# Patient Record
Sex: Female | Born: 1990 | Race: Black or African American | Hispanic: No | Marital: Single | Smoking: Never smoker
Health system: Southern US, Community
[De-identification: ages and names within clinical notes are randomized; demographics above are authoritative.]

## PROBLEM LIST (undated history)

## (undated) HISTORY — PX: TONSILLECTOMY: SUR1361

---

## 2015-11-30 ENCOUNTER — Emergency Department (HOSPITAL_COMMUNITY)
Admission: EM | Admit: 2015-11-30 | Discharge: 2015-11-30 | Disposition: A | Discharge status: Home / Self Care | Payer: Self-pay | Attending: Emergency Medicine | Admitting: Emergency Medicine

## 2015-11-30 ENCOUNTER — Encounter (HOSPITAL_COMMUNITY): Payer: Self-pay | Admitting: Emergency Medicine

## 2015-11-30 DIAGNOSIS — J069 Acute upper respiratory infection, unspecified: Secondary | ICD-10-CM | POA: Insufficient documentation

## 2015-11-30 LAB — RAPID STREP SCREEN (MED CTR MEBANE ONLY): Streptococcus, Group A Screen (Direct): NEGATIVE

## 2015-11-30 NOTE — ED Provider Notes (Signed)
[facility]-EMERGENCY DEPT Provider Note   CSN: [id number] Arrival date & time: 11/30/15  1155  By signing my name below, I, [name], attest that this documentation has been prepared under the direction and in the presence of non-physician practitioner, Teressa Lower, NP. Electronically Signed: [name], Scribe. 11/30/2015. 12:28 PM.    History   Chief Complaint Chief Complaint  Patient presents with  . Sore Throat  . URI  . Headache     The history is provided by the patient. No language interpreter was used.     HPI Comments:  Connie Watson is a 25 y.o. female who presents to the Emergency Department complaining of a sore throat and cough which began yesterday. She reports associated congestion, generalized body aches, and HA . Pt rates her pain a 5/10. She denies fever. No alleviating factors noted; no treatments tried PTA.    History reviewed. No pertinent past medical history.  There are no active problems to display for this patient.   Past Surgical History:  Procedure Laterality Date  . TONSILLECTOMY      OB History    No data available       Home Medications    Prior to Admission medications   Not on File    Family History No family history on file.  Social History Social History  Substance Use Topics  . Smoking status: Not on file  . Smokeless tobacco: Not on file  . Alcohol use Not on file     Allergies   Review of patient's allergies indicates no known allergies.   Review of Systems Review of Systems  Constitutional: Negative for fever.  HENT: Positive for congestion and sore throat.   Respiratory: Positive for cough.   Musculoskeletal: Positive for myalgias.  Neurological: Positive for headaches.     Physical Exam Updated Vital Signs BP 126/66 (BP Location: Right Arm)   Pulse 63   Temp 98.4 F (36.9 C) (Oral)   Resp 18   LMP 11/23/2015   SpO2 100%   Physical Exam  Constitutional: She is oriented to person,  place, and time. She appears well-developed and well-nourished. No distress.  HENT:  Head: Normocephalic and atraumatic.  Right Ear: External ear normal.  Left Ear: External ear normal.  Mouth/Throat: No oropharyngeal exudate.  Eyes: Conjunctivae are normal.  Cardiovascular: Normal rate.   Pulmonary/Chest: Effort normal.  Abdominal: She exhibits no distension.  Neurological: She is alert and oriented to person, place, and time.  Skin: Skin is warm and dry.  Psychiatric: She has a normal mood and affect.  Nursing note and vitals reviewed.    ED Treatments / Results  DIAGNOSTIC STUDIES:  Oxygen Saturation is 100% on RA, normal by my interpretation.    COORDINATION OF CARE:  12:26 PM Discussed treatment plan with pt at bedside and pt agreed to plan.  Labs (all labs ordered are listed, but only abnormal results are displayed) Labs Reviewed  RAPID STREP SCREEN (NOT AT [facility])    EKG  EKG Interpretation None       Radiology No results found.  Procedures Procedures (including critical care time)  Medications Ordered in ED Medications - No data to display   Initial Impression / Assessment and Plan / ED Course  I have reviewed the triage vital signs and the nursing notes.  Pertinent labs & imaging results that were available during my care of the patient were reviewed by me and considered in my medical decision making (see chart for details).  Clinical Course   Likely viral don't think any antibiotics are needed at this time  Pt with negative strep. No abx indicated at this time. Discussed that results of strep culture are pending and patient will be informed if positive result and abx will be called in at that time. Discharge with symptomatic tx. No evidence of dehydration. Pt is tolerating secretions. Presentation not concerning for peritonsillar abscess or spread of infection to deep spaces of the throat; patent airway. Specific return precautions discussed.  Recommended PCP follow up. Pt appears safe for discharge.  Final Clinical Impressions(s) / ED Diagnoses   Final diagnoses:  None    New Prescriptions New Prescriptions   No medications on file   I personally performed the services described in this documentation, which was scribed in my presence. The recorded information has been reviewed and is accurate.    Teressa LowerVrinda Alistar Mcenery, NP 11/30/15 1248    Teressa LowerVrinda Symon Norwood, NP 11/30/15 1249    Shaune Pollackameron Isaacs, MD 11/30/15 1752

## 2015-12-02 LAB — CULTURE, GROUP A STREP (THRC)

## 2015-12-05 ENCOUNTER — Encounter (HOSPITAL_COMMUNITY): Payer: Self-pay | Admitting: Emergency Medicine

## 2015-12-05 ENCOUNTER — Emergency Department (HOSPITAL_COMMUNITY): Payer: Self-pay

## 2015-12-05 ENCOUNTER — Emergency Department (HOSPITAL_COMMUNITY)
Admission: EM | Admit: 2015-12-05 | Discharge: 2015-12-05 | Disposition: A | Discharge status: Home / Self Care | Payer: Self-pay | Attending: Emergency Medicine | Admitting: Emergency Medicine

## 2015-12-05 DIAGNOSIS — R51 Headache: Secondary | ICD-10-CM | POA: Insufficient documentation

## 2015-12-05 DIAGNOSIS — H9203 Otalgia, bilateral: Secondary | ICD-10-CM | POA: Insufficient documentation

## 2015-12-05 DIAGNOSIS — R519 Headache, unspecified: Secondary | ICD-10-CM

## 2015-12-05 LAB — CBC WITH DIFFERENTIAL/PLATELET
BASOS ABS: 0 10*3/uL (ref 0.0–0.1)
BASOS PCT: 0 %
EOS PCT: 1 %
Eosinophils Absolute: 0.1 10*3/uL (ref 0.0–0.7)
HCT: 41.2 % (ref 36.0–46.0)
Hemoglobin: 13.7 g/dL (ref 12.0–15.0)
Lymphocytes Relative: 30 %
Lymphs Abs: 3.7 10*3/uL (ref 0.7–4.0)
MCH: 30.6 pg (ref 26.0–34.0)
MCHC: 33.3 g/dL (ref 30.0–36.0)
MCV: 92 fL (ref 78.0–100.0)
MONO ABS: 0.9 10*3/uL (ref 0.1–1.0)
MONOS PCT: 8 %
Neutro Abs: 7.5 10*3/uL (ref 1.7–7.7)
Neutrophils Relative %: 61 %
PLATELETS: 229 10*3/uL (ref 150–400)
RBC: 4.48 MIL/uL (ref 3.87–5.11)
RDW: 12.2 % (ref 11.5–15.5)
WBC: 12.2 10*3/uL — ABNORMAL HIGH (ref 4.0–10.5)

## 2015-12-05 LAB — BASIC METABOLIC PANEL
Anion gap: 4 — ABNORMAL LOW (ref 5–15)
BUN: 10 mg/dL (ref 6–20)
CALCIUM: 9.4 mg/dL (ref 8.9–10.3)
CO2: 25 mmol/L (ref 22–32)
CREATININE: 0.79 mg/dL (ref 0.44–1.00)
Chloride: 107 mmol/L (ref 101–111)
GFR calc Af Amer: >60 mL/min (ref >60)
GLUCOSE: 78 mg/dL (ref 65–99)
Potassium: 4.1 mmol/L (ref 3.5–5.1)
Sodium: 136 mmol/L (ref 135–145)

## 2015-12-05 LAB — I-STAT BETA HCG BLOOD, ED (MC, WL, AP ONLY): I-stat hCG, quantitative: <5 m[IU]/mL (ref <5)

## 2015-12-05 MED ORDER — HYDROCODONE-ACETAMINOPHEN 5-325 MG PO TABS: 1.0000 | ORAL_TABLET | Freq: Once | ORAL | Status: DC

## 2015-12-05 MED ORDER — DIPHENHYDRAMINE HCL 25 MG PO CAPS
25.0000 mg | ORAL_CAPSULE | Freq: Once | ORAL | Status: AC
  Administered 2015-12-05: 25 mg via ORAL
  Filled 2015-12-05: qty 1

## 2015-12-05 MED ORDER — FLUTICASONE PROPIONATE 50 MCG/ACT NA SUSP: 2.0000 | Freq: Every day | NASAL | 0 refills | Status: AC

## 2015-12-05 MED ORDER — ONDANSETRON HCL 4 MG/2ML IJ SOLN: 4.0000 mg | Freq: Once | INTRAMUSCULAR | Status: DC

## 2015-12-05 MED ORDER — CARBAMIDE PEROXIDE 6.5 % OT SOLN
5.0000 [drp] | Freq: Once | OTIC | Status: AC
  Administered 2015-12-05: 5 [drp] via OTIC
  Filled 2015-12-05: qty 15

## 2015-12-05 MED ORDER — PSEUDOEPHEDRINE HCL ER 120 MG PO TB12: 120.0000 mg | ORAL_TABLET | Freq: Two times a day (BID) | ORAL | 0 refills | Status: AC

## 2015-12-05 MED ORDER — HYDROCODONE-ACETAMINOPHEN 5-325 MG PO TABS
1.0000 | ORAL_TABLET | Freq: Once | ORAL | Status: AC
  Administered 2015-12-05: 1 via ORAL
  Filled 2015-12-05: qty 1

## 2015-12-05 MED ORDER — DEXAMETHASONE SODIUM PHOSPHATE 10 MG/ML IJ SOLN
10.0000 mg | Freq: Once | INTRAMUSCULAR | Status: AC
  Administered 2015-12-05: 10 mg via INTRAMUSCULAR
  Filled 2015-12-05: qty 1

## 2015-12-05 MED ORDER — DOXYCYCLINE HYCLATE 100 MG PO CAPS: 100.0000 mg | ORAL_CAPSULE | Freq: Two times a day (BID) | ORAL | 0 refills | Status: AC

## 2015-12-05 MED ORDER — KETOROLAC TROMETHAMINE 60 MG/2ML IM SOLN
60.0000 mg | Freq: Once | INTRAMUSCULAR | Status: AC
  Administered 2015-12-05: 60 mg via INTRAMUSCULAR
  Filled 2015-12-05: qty 2

## 2015-12-05 MED ORDER — ONDANSETRON 4 MG PO TBDP
8.0000 mg | ORAL_TABLET | Freq: Once | ORAL | Status: AC
  Administered 2015-12-05: 8 mg via ORAL
  Filled 2015-12-05: qty 2

## 2015-12-05 MED ORDER — DOXYCYCLINE HYCLATE 100 MG PO TABS
100.0000 mg | ORAL_TABLET | Freq: Once | ORAL | Status: AC
  Administered 2015-12-05: 100 mg via ORAL
  Filled 2015-12-05: qty 1

## 2015-12-05 NOTE — ED Triage Notes (Signed)
PT reported To PA  That she passed out this morning.

## 2015-12-05 NOTE — Discharge Instructions (Signed)
Read the information below.  Your head CT did not show any intracranial abnormalities. You did show some thickening of your sinuses, which may be contributing to your headache and ear pain.  We were able to get wax out of your right ear. Your left ear still has wax. You declined manual removal. Please instill 5 drops twice daily in your left ear for the next four days to help relieve the ear wax.  I have prescribed flonase for nasal congestion, sudafed to help dry the sinuses, and doxycyline to treat possible sinus infection.  You can take tylenol 650mg  every 6hrs or motrin 400mg  every 6hrs for pain relief.  I encourage you to follow up with both ENT and [facility] for further management of your ear pain and headaches.  You may return to the Emergency Department at any time for worsening condition or any new symptoms that concern you. Return to ED if you develop fever, neck pain, trouble swallowing, trouble breathing, neurologic symptoms, or any new/concerning symptoms.

## 2015-12-05 NOTE — ED Provider Notes (Signed)
[facility] Provider Note   CSN: [id number] Arrival date & time: 12/05/15  1225   By signing my name below, I, Clovis Pu, attest that this documentation has been prepared under the direction and in the presence of  Arvilla Meres, PA-C. Electronically Signed: Clovis Pu, ED Scribe. 12/05/15. 4:45 PM.   History   Chief Complaint Chief Complaint  Patient presents with  . Headache  . Otalgia    The history is provided by the patient. No language interpreter was used.   HPI Comments:  Connie Watson is a 25 y.o. female who presents to the Emergency Department complaining of "9/10" throbbing diffuse headaches and b/l ear pain x several years. Pt notes associated light-headedness and intermittent tinnitus. She states she is here today because she felt light-headed and was about to pass out. Pt also notes she has a headaches every day which begin when she wakes up. Denies any allergies. No alleviating factors noted. She has been given ear drops in the past for her ear pain with minimal relief. Pt denies head trauma, syncope, nausea, vomiting, numbness to her BUE and BLE, visual disturbances,neck pain, and trouble swallowing, sinus pressure, nasal congestion, rhinorrhea, cough, chest pain, SOB, rash, fevers, hematuria, dysuria, facial droop and slurred speech. She also denies a hx of cancer, recent travels, hormone therapy, recent illnesses. Pt does not have a PCP and has never seen a specialist for her headaches.   History reviewed. No pertinent past medical history.  There are no active problems to display for this patient.   Past Surgical History:  Procedure Laterality Date  . TONSILLECTOMY      OB History    No data available       Home Medications    Prior to Admission medications   Medication Sig Start Date End Date Taking? Authorizing Provider  acetaminophen (TYLENOL) 325 MG tablet Take 650 mg by mouth every 6 (six) hours as needed for mild pain.   Yes Historical  Provider, MD  ibuprofen (ADVIL,MOTRIN) 200 MG tablet Take 200 mg by mouth every 6 (six) hours as needed for moderate pain.   Yes Historical Provider, MD  doxycycline (VIBRAMYCIN) 100 MG capsule Take 1 capsule (100 mg total) by mouth 2 (two) times daily. 12/05/15 12/10/15  Lona Kettle, PA-C  fluticasone (FLONASE) 50 MCG/ACT nasal spray Place 2 sprays into both nostrils daily. 12/05/15   Lona Kettle, PA-C  pseudoephedrine (SUDAFED 12 HOUR) 120 MG 12 hr tablet Take 1 tablet (120 mg total) by mouth 2 (two) times daily. 12/05/15   Lona Kettle, PA-C    Family History History reviewed. No pertinent family history.  Social History Social History  Substance Use Topics  . Smoking status: Never Smoker  . Smokeless tobacco: Never Used  . Alcohol use Yes     Allergies   Penicillins   Review of Systems Review of Systems  Constitutional: Negative for fever.  HENT: Positive for ear pain and tinnitus ( intermittent ). Negative for congestion, rhinorrhea, sinus pressure and trouble swallowing.   Eyes: Negative for visual disturbance.  Respiratory: Negative for cough and shortness of breath.   Cardiovascular: Negative for chest pain.  Gastrointestinal: Negative for nausea and vomiting.  Genitourinary: Negative for dysuria and hematuria.  Musculoskeletal: Negative for neck pain.  Skin: Negative for rash.  Neurological: Positive for light-headedness and headaches. Negative for syncope, facial asymmetry, speech difficulty, weakness and numbness.     Physical Exam Updated Vital Signs BP 110/56 (BP Location: Left  Arm)   Pulse 61   Temp 98 F (36.7 C) (Oral)   Resp 18   LMP 11/23/2015   SpO2 100%   Physical Exam  Constitutional: She appears well-developed and well-nourished. No distress.  HENT:  Head: Normocephalic and atraumatic.  Mouth/Throat: Uvula is midline, oropharynx is clear and moist and mucous membranes are normal. No trismus in the jaw. No oropharyngeal  exudate. No tonsillar exudate.  Cerumen in bilateral ears. No trismus. Uvula midline.   Eyes: Conjunctivae and EOM are normal. Pupils are equal, round, and reactive to light. Right eye exhibits no discharge. Left eye exhibits no discharge. No scleral icterus.  Neck: Normal range of motion and phonation normal. Neck supple. No neck rigidity. Normal range of motion present.  No nuchal rigidity.   Cardiovascular: Normal rate, regular rhythm, normal heart sounds and intact distal pulses.   No murmur heard. Pulmonary/Chest: Effort normal and breath sounds normal. No stridor. No respiratory distress. She has no wheezes. She has no rales.  Abdominal: Soft. Bowel sounds are normal. She exhibits no distension. There is no tenderness. There is no rigidity, no rebound, no guarding and no CVA tenderness.  Musculoskeletal: Normal range of motion.  Lymphadenopathy:    She has no cervical adenopathy.  Neurological: She is alert. She is not disoriented. She exhibits normal muscle tone. Coordination and gait normal. GCS eye subscore is 4. GCS verbal subscore is 5. GCS motor subscore is 6.  Mental Status:  Alert, thought content appropriate, able to give a coherent history. Speech fluent without evidence of aphasia. Able to follow 2 step commands without difficulty.  Cranial Nerves:  II:  pupils equal, round, reactive to light III,IV, VI: ptosis not present, extra-ocular motions intact bilaterally  V,VII: smile symmetric, facial light touch sensation equal VIII: hearing grossly normal to voice  X: uvula elevates symmetrically  XI: bilateral shoulder shrug symmetric and strong XII: midline tongue extension without fassiculations Motor:  Normal tone. 5/5 in upper and lower extremities bilaterally including strong and equal grip strength and dorsiflexion/plantar flexion Sensory: light touch normal in all extremities. Cerebellar: normal finger-to-nose with bilateral upper extremities Gait: normal gait and  balance CV: distal pulses palpable throughout   Skin: Skin is warm and dry. She is not diaphoretic.  Psychiatric: She has a normal mood and affect. Her behavior is normal.  Nursing note and vitals reviewed.    ED Treatments / Results  DIAGNOSTIC STUDIES:  Oxygen Saturation is 100% on RA, normal by my interpretation.    COORDINATION OF CARE:  4:42 PM Discussed treatment plan with pt at bedside and pt agreed to plan.  Labs (all labs ordered are listed, but only abnormal results are displayed) Labs Reviewed  BASIC METABOLIC PANEL - Abnormal; Notable for the following:       Result Value   Anion gap 4 (*)    All other components within normal limits  CBC WITH DIFFERENTIAL/PLATELET - Abnormal; Notable for the following:    WBC 12.2 (*)    All other components within normal limits  I-STAT BETA HCG BLOOD, ED ([facility], [facility], [facility] ONLY)    EKG  EKG Interpretation None       Radiology Ct Head Wo Contrast  Result Date: 12/05/2015 CLINICAL DATA:  Headache for several weeks.  Bilateral headache. EXAM: CT HEAD WITHOUT CONTRAST TECHNIQUE: Contiguous axial images were obtained from the base of the skull through the vertex without intravenous contrast. COMPARISON:  None. FINDINGS: Brain: No evidence for acute hemorrhage, mass lesion,  midline shift, hydrocephalus or large infarct. Vascular: No hyperdense vessel or unexpected calcification. Skull: Normal. Negative for fracture or focal lesion. Sinuses/Orbits: Mucosal thickening in the visualized ethmoid air cells and sphenoid sinuses. Other: None. IMPRESSION: No acute intracranial abnormality. Paranasal sinus disease. Electronically Signed   By: Richarda Overlie M.D.   On: 12/05/2015 17:33    Procedures Procedures (including critical care time)  Medications Ordered in ED Medications  carbamide peroxide (DEBROX) 6.5 % otic solution 5 drop (5 drops Both Ears Given 12/05/15 1731)  diphenhydrAMINE (BENADRYL) capsule 25 mg (25 mg Oral Given 12/05/15 1748)   ketorolac (TORADOL) injection 60 mg (60 mg Intramuscular Given 12/05/15 1748)  ondansetron (ZOFRAN-ODT) disintegrating tablet 8 mg (8 mg Oral Given 12/05/15 1748)  HYDROcodone-acetaminophen (NORCO/VICODIN) 5-325 MG per tablet 1 tablet (1 tablet Oral Given 12/05/15 1907)  dexamethasone (DECADRON) injection 10 mg (10 mg Intramuscular Given 12/05/15 2017)  doxycycline (VIBRA-TABS) tablet 100 mg (100 mg Oral Given 12/05/15 2018)     Initial Impression / Assessment and Plan / ED Course  I have reviewed the triage vital signs and the nursing notes.  Pertinent labs & imaging results that were available during my care of the patient were reviewed by me and considered in my medical decision making (see chart for details).  Clinical Course  Value Comment By Time   Following ear wax removal, normal appearing right ear canal and TM. Ear wax still blocking left TM. Patient declines manual removal. Pain persists.  Herminio Commons [place], [place] 40/98 [phone]  CT Head Wo Contrast Reviewed Lona Kettle, [place] 47/82 [phone]   On re-evaluation patient endorses improvement in headache. 5/10.  Lona Kettle, [place] 13/08 2016    Patient presents to ED with complaint of headache and b/l otalgia x 1 year. Patient is afebrile and non-toxic appearing in NAD. VSS. Physical exam remarkable for b/l cerumen impaction. Physical exam otherwise re-assuring. Normal neurologic exam. Low suspicion for SAH, ICH, meningitis, or temporal arteritis. Pt is afebrile, no focal neuro deficits, nuchal rigidity, or changes in vision. However, given morning headaches and persistent headaches x 1 yr will CT head to r/o intracranial pathology. Given near syncope as reported by pt, will check basic labs and EKG.   Following ear wax removal with debrox right ear canal cleared, no swelling or erythema of ear canal noted. TM is translucent without erythema, injection, or effusion. Cerumen persists in left ear canal, patient declines manual  removal. Labs remarkable for mild bump in WBC - may be secondary to recent URI infection. EKG shows NSR, normal intervals, no evidence of STEMI or right heart strain. CT head negative for mass lesion or acute intracranial pathology; of incidental finding is mucosal thickening of sphenoid and ethmoid sinuses. Suspect ?sinusitis (chronic vs. Acute given duration of symptoms;however, recent URI) vs. ?eustachian tube dysfunction given chronicity of sxs. Headache improved following treatment in ED. Will given dose of steroids and ABX. Rx flonase, sudafed, and ABX. Discussed continued use of debrox x 4 days for left earwax removal. Encouraged follow up with ENT and headache wellness clinic given chronicity of symptoms, contact information provided. Return precautions discussed. Pt voiced understanding and is agreeable.     Final Clinical Impressions(s) / ED Diagnoses   Final diagnoses:  Nonintractable headache, unspecified chronicity pattern, unspecified headache type  Otalgia of both ears    New Prescriptions Discharge Medication List as of 12/05/2015  8:12 PM    START taking these medications   Details  doxycycline (VIBRAMYCIN) 100  MG capsule Take 1 capsule (100 mg total) by mouth 2 (two) times daily., Starting Mon 12/05/2015, Until Sat 12/10/2015, Print    fluticasone (FLONASE) 50 MCG/ACT nasal spray Place 2 sprays into both nostrils daily., Starting Mon 12/05/2015, Print    pseudoephedrine (SUDAFED 12 HOUR) 120 MG 12 hr tablet Take 1 tablet (120 mg total) by mouth 2 (two) times daily., Starting Mon 12/05/2015, Print      I personally performed the services described in this documentation, which was scribed in my presence. The recorded information has been reviewed and is accurate.     Lona KettleAshley Laurel Rito Lecomte, PA-C 12/05/15 [address]2025    [place] Laurel BrushMeyer, [place] 12/06/15 [id number]    Shaune Pollackameron Isaacs, MD 12/06/15 1341

## 2015-12-05 NOTE — ED Triage Notes (Signed)
Pt sts HA x several weeks and bilateral ear pain

## 2017-11-03 IMAGING — CT CT HEAD W/O CM
4 series · 16 of 47 positions shown, 18 images · non-contrast
Comparison: None.

CLINICAL DATA: Headache for several weeks.  Bilateral headache.

EXAM:
CT HEAD WITHOUT CONTRAST
TECHNIQUE: Contiguous axial images were obtained from the base of the skull
through the vertex without intravenous contrast.

[Series 2: head without · axial · non-contrast · 0.40mm/px · z∈[-19,+81]mm · 7 of 28 slices shown, 9 images]
[im 4/28  brain]
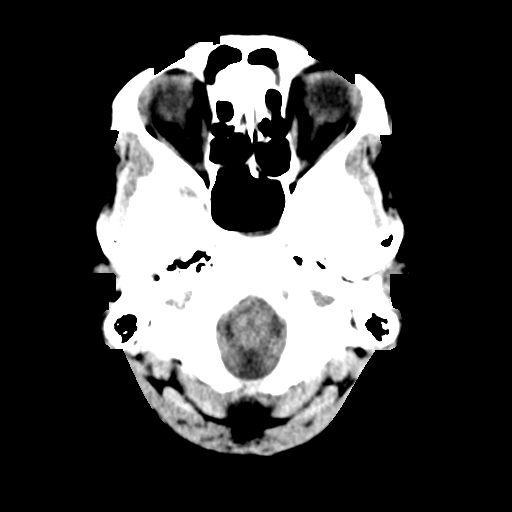
[im 4/28  bone]
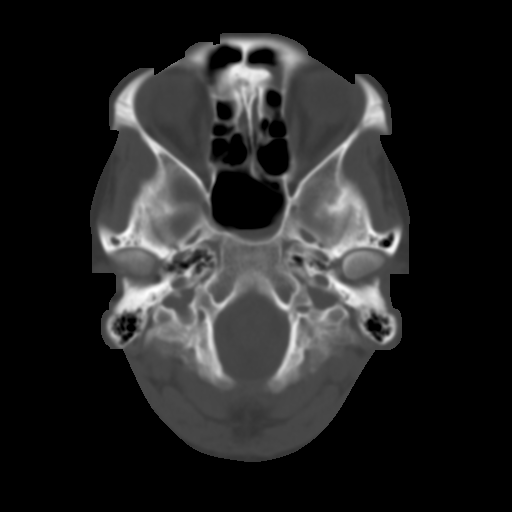
[im 7/28  brain]
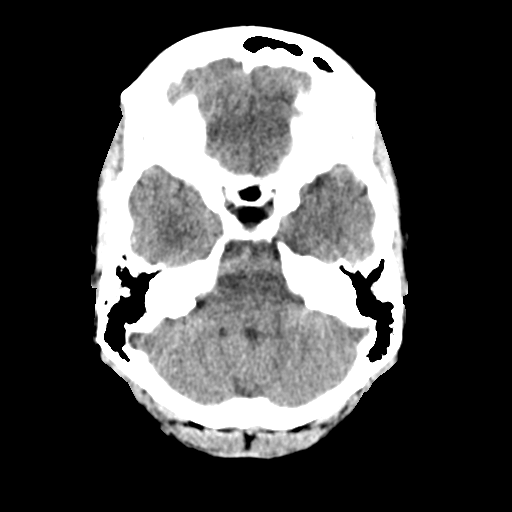
[im 11/28  brain]
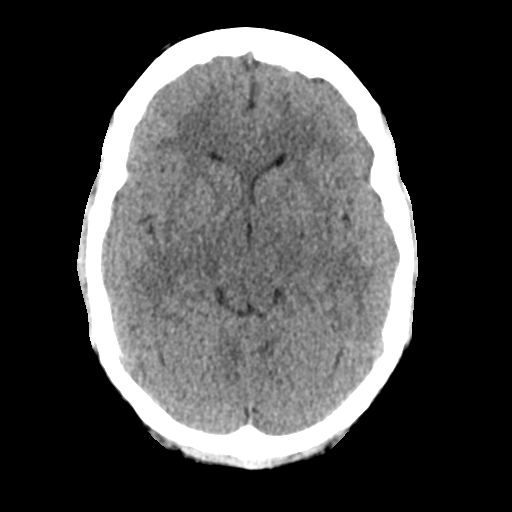
[im 14/28  brain]
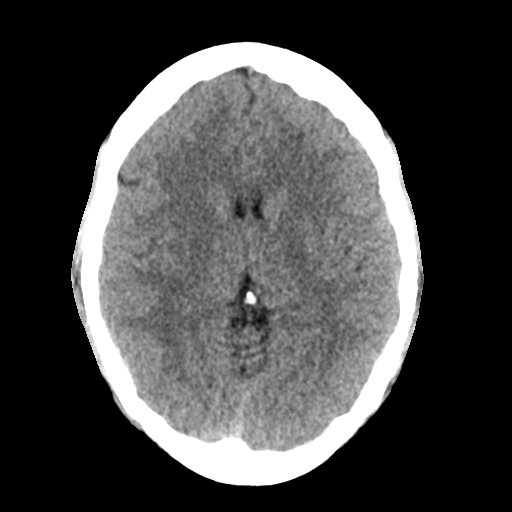
[im 17/28  brain]
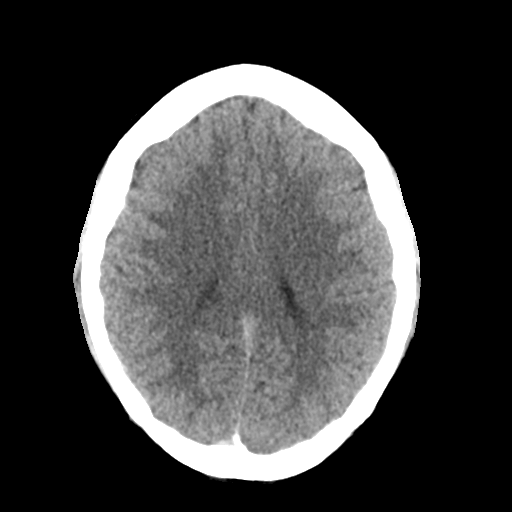
[im 17/28  bone]
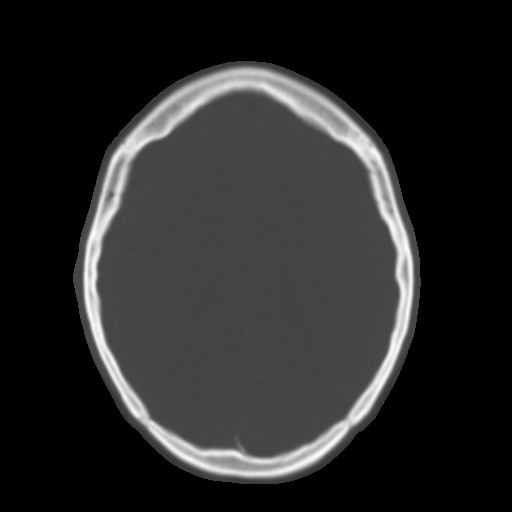
[im 21/28  brain]
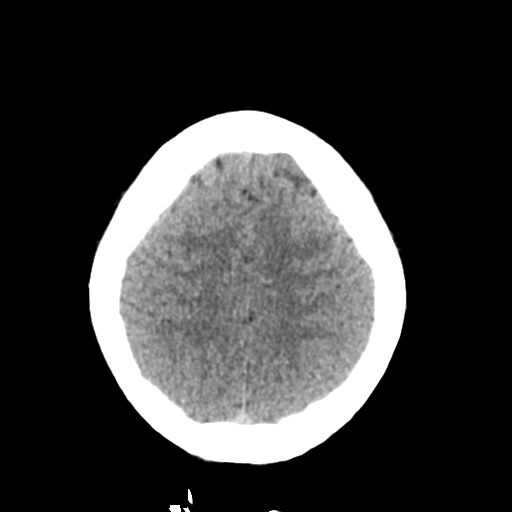
[im 24/28  brain]
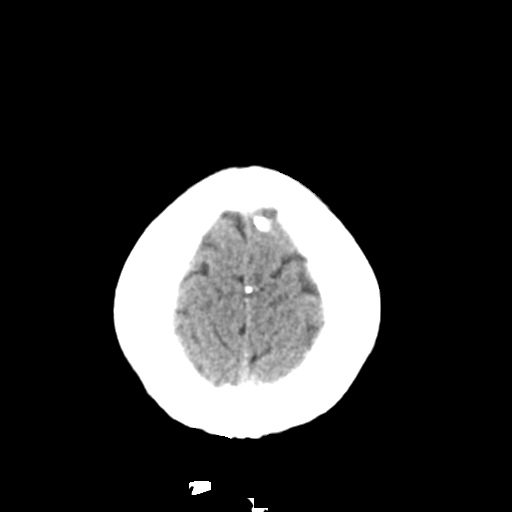

[Series 3: head bone · axial · 0.40mm/px · z∈[-22,+6]mm · 3 of 70 slices shown]
[im 7/70  bone]
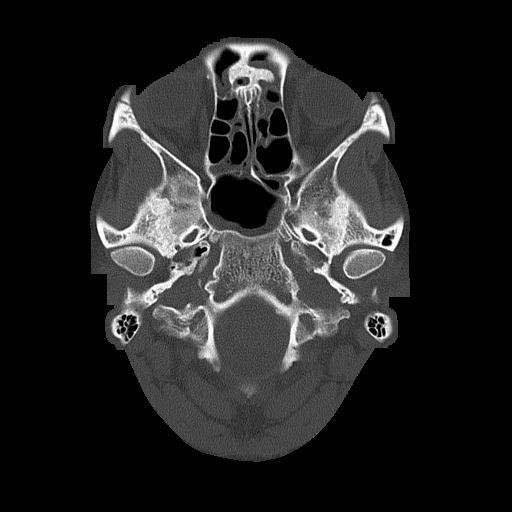
[im 14/70  bone]
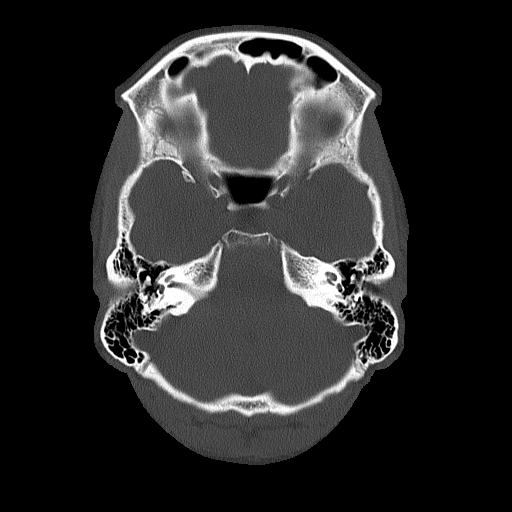
[im 21/70  bone]
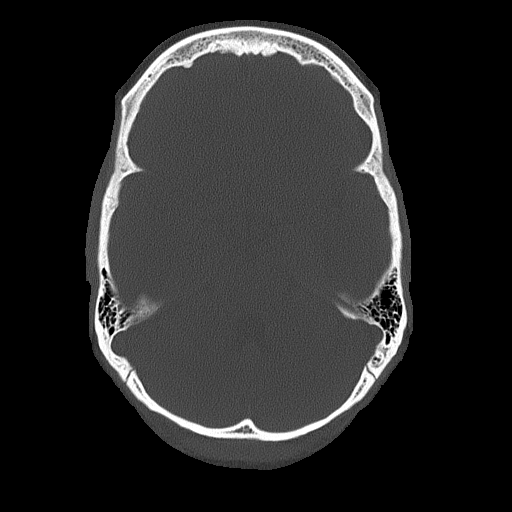

[Series 4: head without cor · coronal · non-contrast · 0.29mm/px · 3 of 65 slices shown]
[im 22/65  brain]
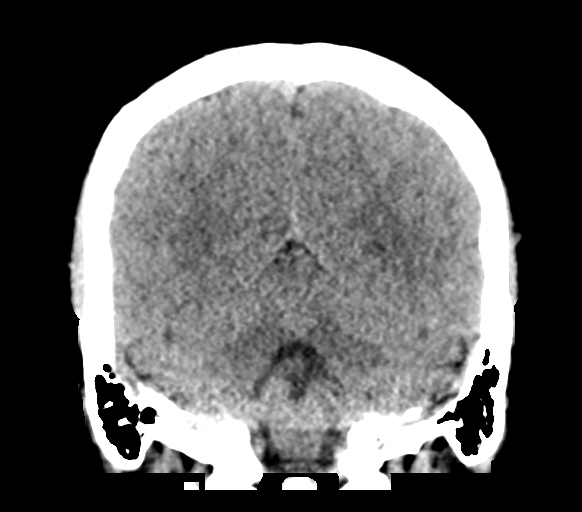
[im 29/65  brain]
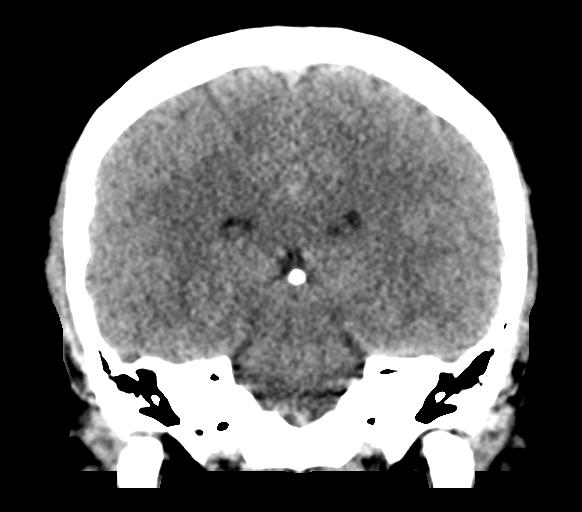
[im 36/65  brain]
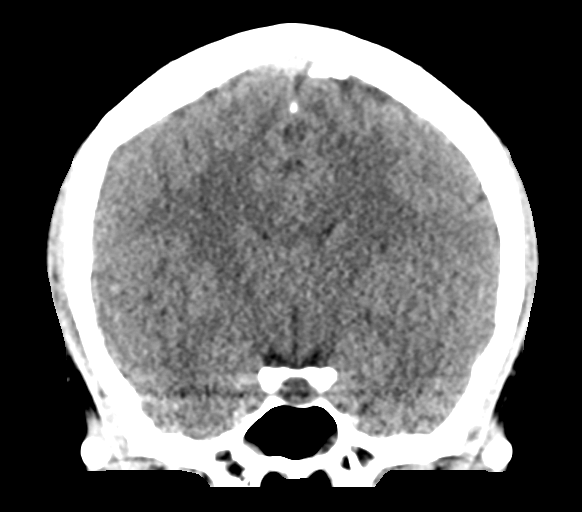

[Series 5: head without sag · sagittal · non-contrast · 0.28mm/px · 3 of 53 slices shown]
[im 18/53  brain]
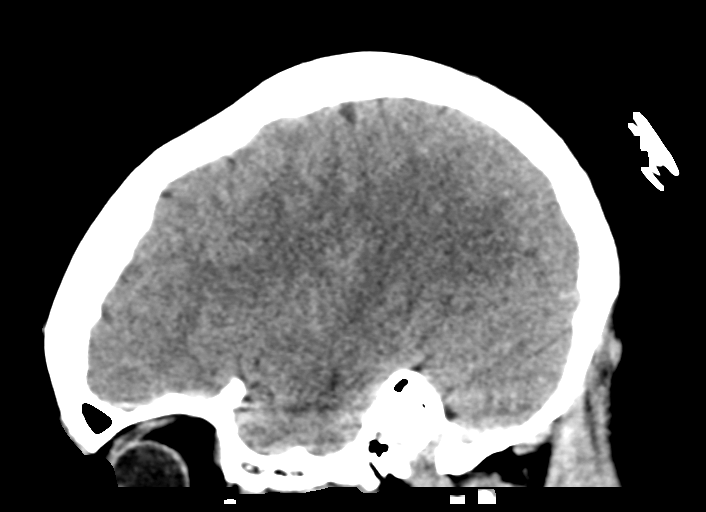
[im 27/53  brain]
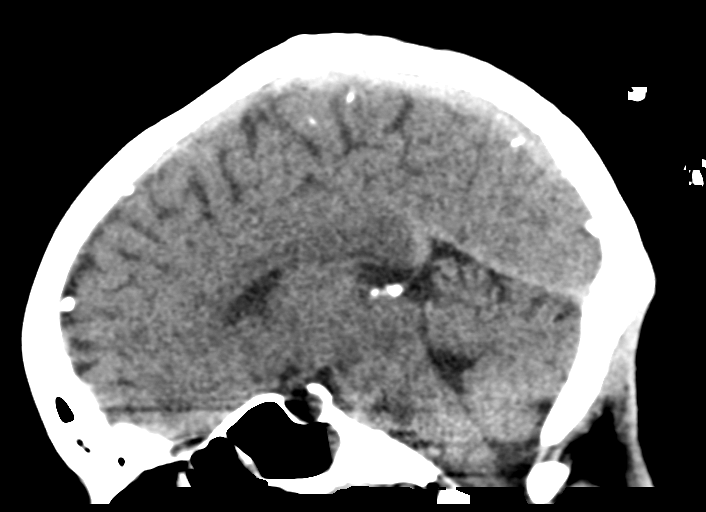
[im 35/53  brain]
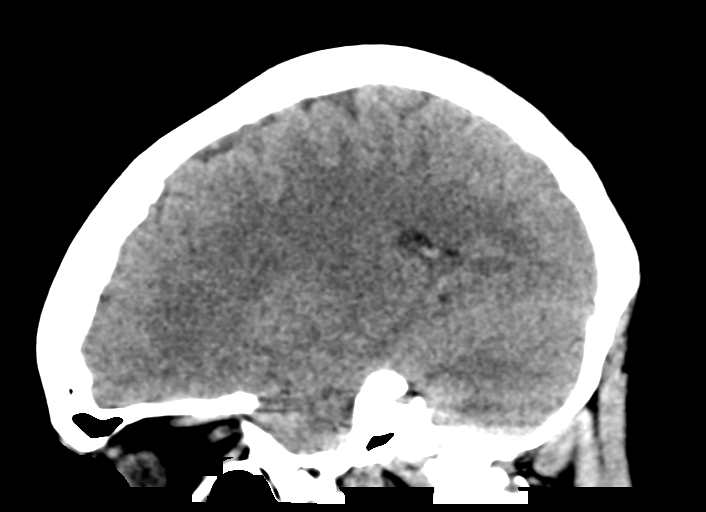

[16 of 47 positions shown; findings below may reference images not displayed]

FINDINGS: Brain: No evidence for acute hemorrhage, mass lesion, midline shift,
hydrocephalus or large infarct.

Vascular: No hyperdense vessel or unexpected calcification.

Skull: Normal. Negative for fracture or focal lesion.

Sinuses/Orbits: Mucosal thickening in the visualized ethmoid air
cells and sphenoid sinuses.

Other: None.
IMPRESSION: No acute intracranial abnormality.

Paranasal sinus disease.
